# Patient Record
Sex: Female | Born: 1945 | Race: White | Marital: Married
Health system: Southern US, Community
[De-identification: ages and names within clinical notes are randomized; demographics above are authoritative.]

---

## 2013-12-10 ENCOUNTER — Other Ambulatory Visit: Payer: Self-pay | Admitting: Orthopedic Surgery

## 2013-12-10 DIAGNOSIS — M25511 Pain in right shoulder: Secondary | ICD-10-CM

## 2013-12-18 ENCOUNTER — Ambulatory Visit
Admission: RE | Admit: 2013-12-18 | Discharge: 2013-12-18 | Disposition: A | Discharge status: Home / Self Care | Payer: Managed Care, Other (non HMO) | Source: Ambulatory Visit | Attending: Orthopedic Surgery | Admitting: Orthopedic Surgery

## 2013-12-18 DIAGNOSIS — M25511 Pain in right shoulder: Secondary | ICD-10-CM

## 2015-10-16 ENCOUNTER — Other Ambulatory Visit: Payer: Self-pay | Admitting: Orthopedic Surgery

## 2015-10-16 DIAGNOSIS — M25561 Pain in right knee: Secondary | ICD-10-CM

## 2015-10-21 ENCOUNTER — Ambulatory Visit
Admission: RE | Admit: 2015-10-21 | Discharge: 2015-10-21 | Disposition: A | Discharge status: Home / Self Care | Payer: Managed Care, Other (non HMO) | Source: Ambulatory Visit | Attending: Orthopedic Surgery | Admitting: Orthopedic Surgery

## 2015-10-21 DIAGNOSIS — M25561 Pain in right knee: Secondary | ICD-10-CM

## 2016-08-02 IMAGING — MR MR KNEE*R* W/O CM
4 of 6 series · 18 of 40 positions shown · non-contrast
Comparison: None.

CLINICAL DATA: Right knee pain since August 2015. No known
injury.

EXAM:
MRI OF THE RIGHT KNEE WITHOUT CONTRAST
TECHNIQUE: Multiplanar, multisequence MR imaging of the knee was performed. No
intravenous contrast was administered.

[Series 3: [id number]_fs_tra · axial · 3.5mm · 0.39mm/px · z∈[-33,+24]mm · 3 of 23 slices shown]
[im 4/23]
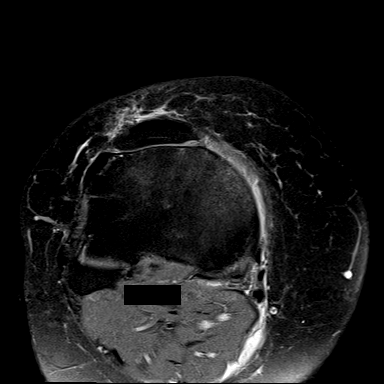
[im 12/23]
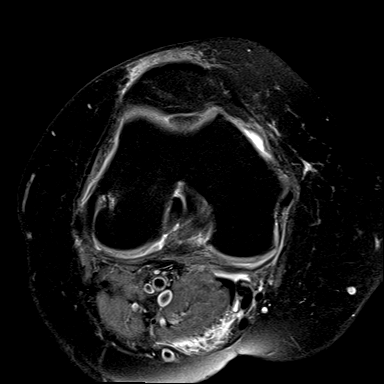
[im 19/23]
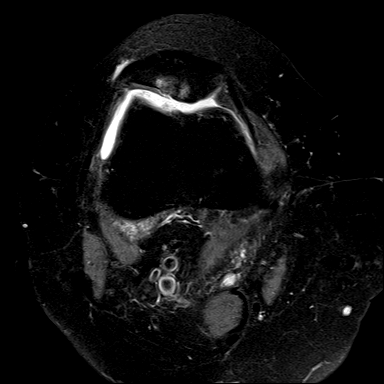

[Series 5: T2 fat-sat · coronal · 3.0mm · 0.29mm/px · 6 of 30 slices shown]
[im 1/30]
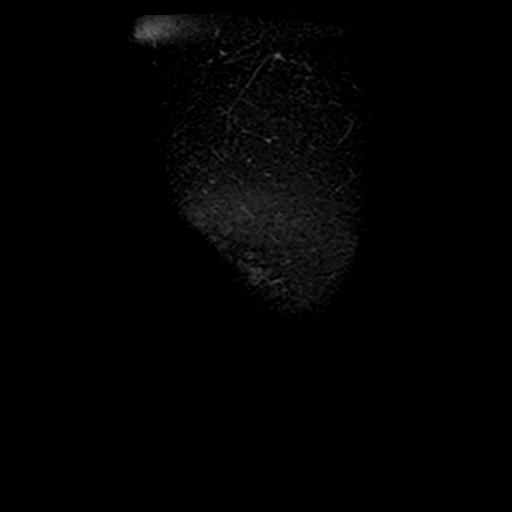
[im 5/30]
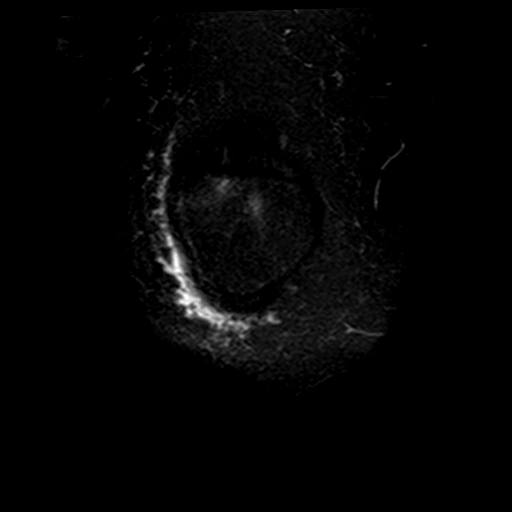
[im 9/30]
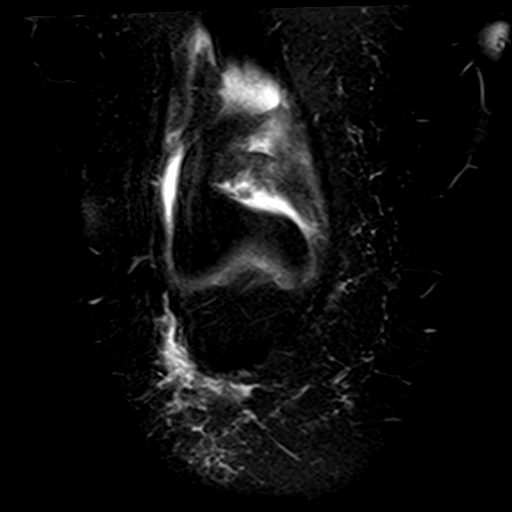
[im 13/30]
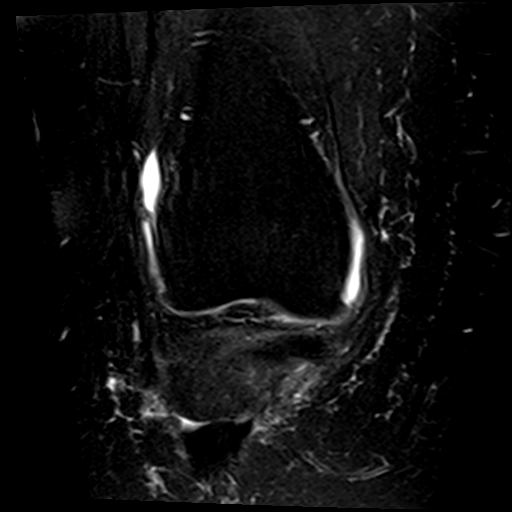
[im 17/30]
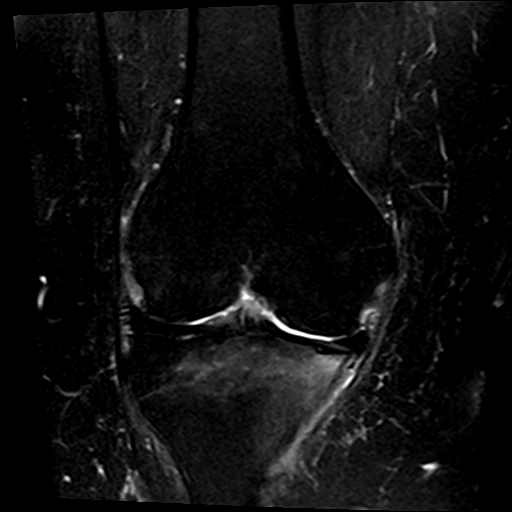
[im 25/30]
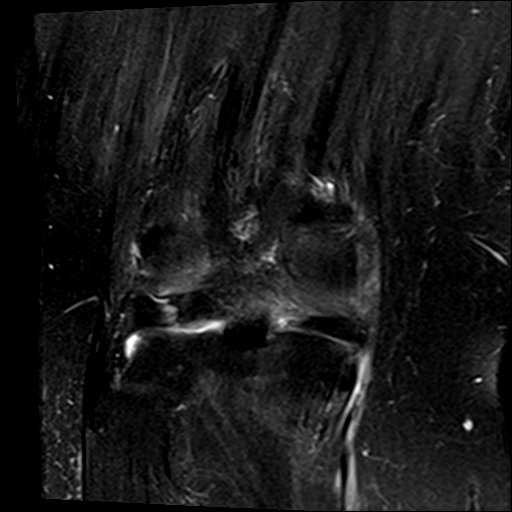

[Series 6: T1 · coronal · 3.0mm · 0.23mm/px · 3 of 30 slices shown]
[im 5/30]
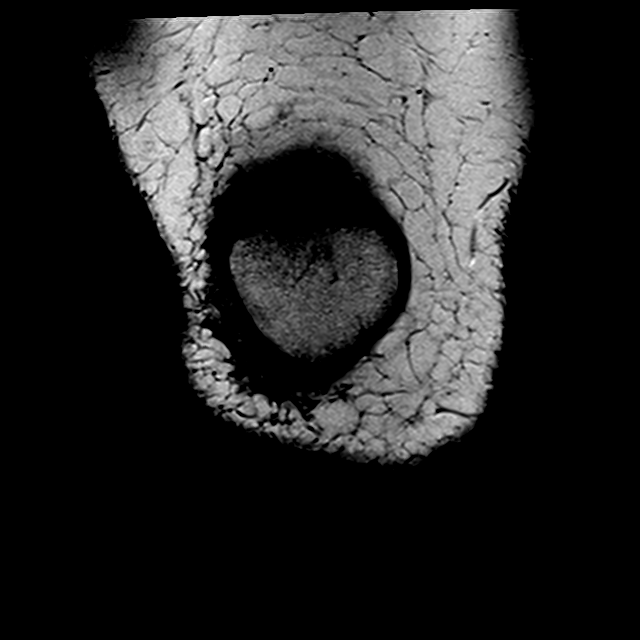
[im 17/30]
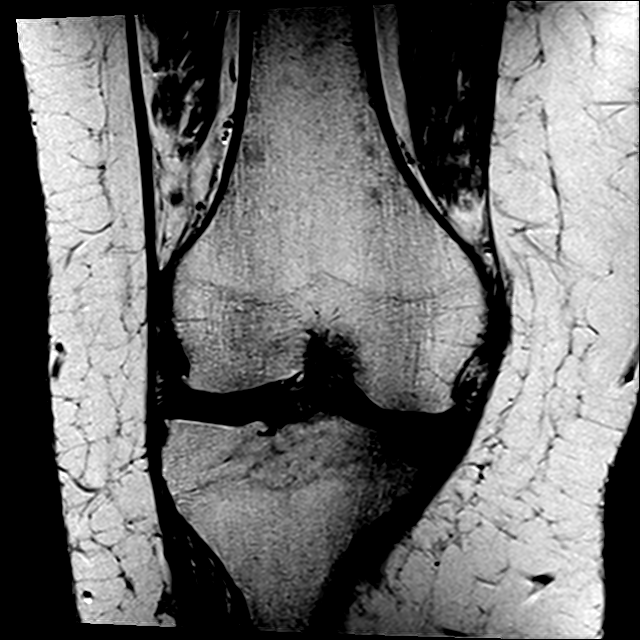
[im 25/30]
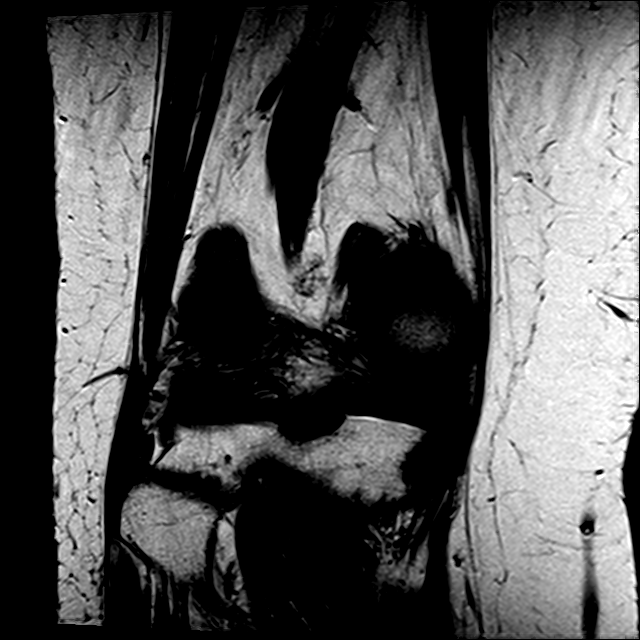

[Series 7: PD fat-sat · sagittal · 4.0mm · 0.31mm/px · 6 of 22 slices shown]
[im 1/22]
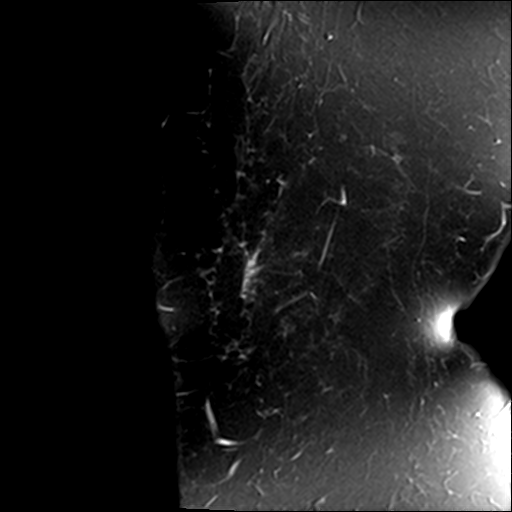
[im 5/22]
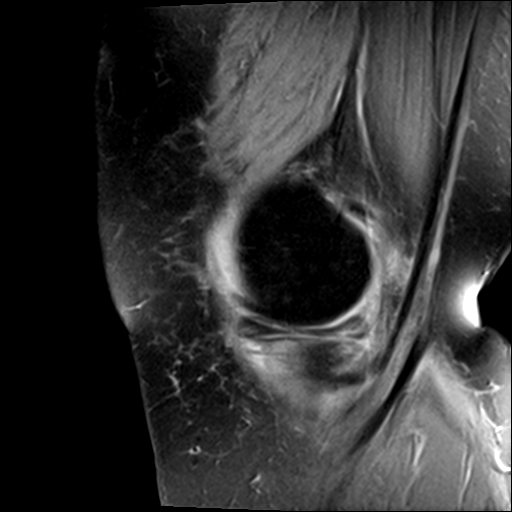
[im 9/22]
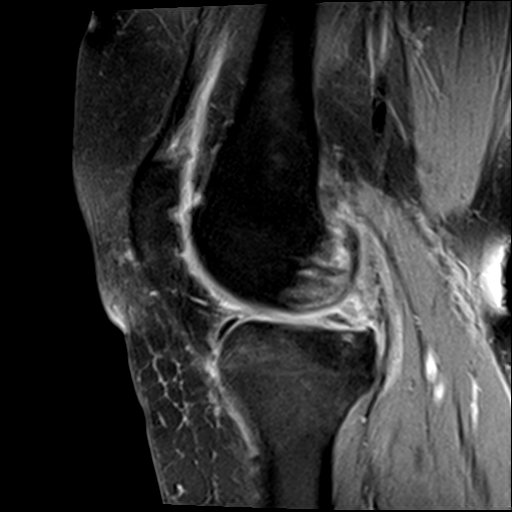
[im 13/22]
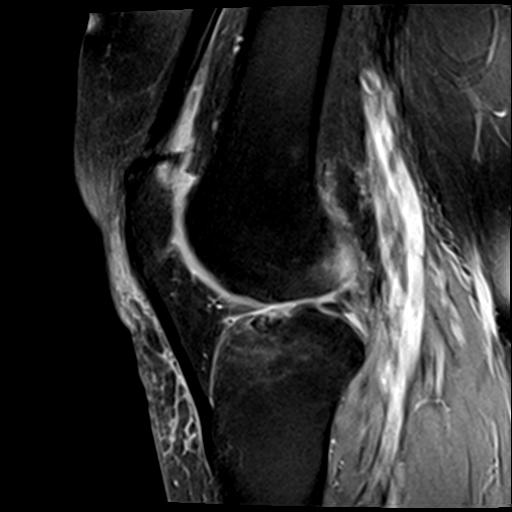
[im 17/22]
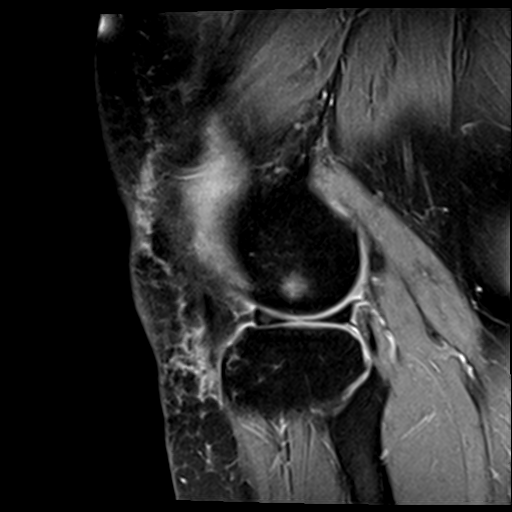
[im 22/22]
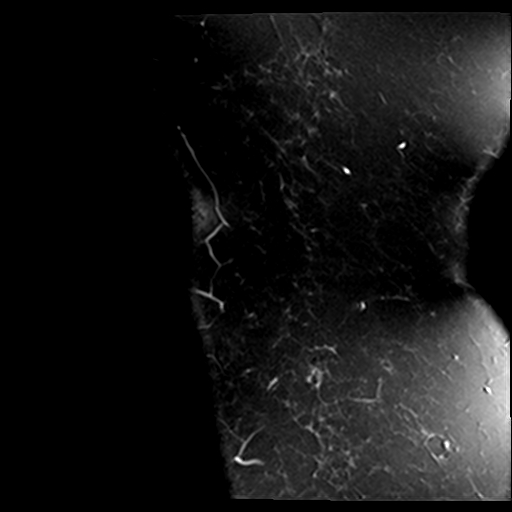

[18 of 40 positions shown; findings below may reference images not displayed]

FINDINGS: MENISCI

Medial meniscus: Posterior horn tear near the meniscal root with
partial detachment and mild medial protrusion of the meniscus. There
is also an inferior articular surface tear at the posterior horn mid
body junction region.

Lateral meniscus: Intrasubstance degenerative changes but no
discrete tear.

LIGAMENTS

Cruciates:  Intact.  Mild mucoid degeneration of the ACL and PCL.

Collaterals:  Intact.  Moderate MCL and pes anserine bursitis.

CARTILAGE

Patellofemoral: Advanced degenerative chondrosis with focus of grade
4 chondromalacia involving the lateral facet near the apex with
large subchondral cyst. There is also a delamination process
involving the lateral facet and full-thickness cartilage loss along
the patellar apex.

Medial: Moderate degenerative chondrosis along with joint space
narrowing and osteophytic spurring.

Lateral: Mild to moderate degenerative chondrosis with early joint
space narrowing and osteophytic spurring.

Joint:  Small joint effusion and moderate synovitis.

Popliteal Fossa:  Small leaking Baker's cyst.

Extensor Mechanism: The patella retinacular structures are intact
and the quadriceps and patellar tendons are intact.

Bones: Small subchondral stress fracture or small focus of
spontaneous osteonecrosis involving the medial tibial plateau with
significant surrounding marrow edema.
IMPRESSION: 1. Medial meniscus tears as described above. The meniscus is likely
dysfunctional and may be contributing to the medial compartment
degenerative changes and the subchondral stress fracture or focus of
osteonecrosis involving the medial tibial plateau.
2. Intact ligamentous structures. Mild mucoid degeneration of the
ACL and PCL.
3. MCL and pes anserine bursitis.
4. Significant tricompartmental degenerative changes as described
above.
5. Small joint effusion and moderate synovitis. Small leaking
Baker's cyst.
